# Patient Record
Sex: Female | Born: 1990 | Race: White | Hispanic: No | Smoking: Current every day smoker
Health system: Southern US, Community
[De-identification: ages and names within clinical notes are randomized; demographics above are authoritative.]

## PROBLEM LIST (undated history)

## (undated) DIAGNOSIS — G43909 Migraine, unspecified, not intractable, without status migrainosus: Secondary | ICD-10-CM

## (undated) DIAGNOSIS — T7840XA Allergy, unspecified, initial encounter: Secondary | ICD-10-CM

## (undated) DIAGNOSIS — F32A Depression, unspecified: Secondary | ICD-10-CM

## (undated) DIAGNOSIS — Z9289 Personal history of other medical treatment: Secondary | ICD-10-CM

## (undated) DIAGNOSIS — F329 Major depressive disorder, single episode, unspecified: Secondary | ICD-10-CM

## (undated) DIAGNOSIS — B019 Varicella without complication: Secondary | ICD-10-CM

## (undated) DIAGNOSIS — B009 Herpesviral infection, unspecified: Secondary | ICD-10-CM

## (undated) DIAGNOSIS — K219 Gastro-esophageal reflux disease without esophagitis: Secondary | ICD-10-CM

## (undated) HISTORY — DX: Gastro-esophageal reflux disease without esophagitis: K21.9

## (undated) HISTORY — DX: Personal history of other medical treatment: Z92.89

## (undated) HISTORY — DX: Herpesviral infection, unspecified: B00.9

## (undated) HISTORY — DX: Migraine, unspecified, not intractable, without status migrainosus: G43.909

## (undated) HISTORY — DX: Allergy, unspecified, initial encounter: T78.40XA

## (undated) HISTORY — DX: Depression, unspecified: F32.A

## (undated) HISTORY — DX: Varicella without complication: B01.9

## (undated) HISTORY — DX: Major depressive disorder, single episode, unspecified: F32.9

---
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
## 2009-07-20 ENCOUNTER — Ambulatory Visit: Payer: Self-pay | Admitting: Internal Medicine

## 2010-03-07 IMAGING — CT CT HEAD WITHOUT CONTRAST
2 series · 16 of 30 positions shown, 20 images · non-contrast
Comparison: none

REASON FOR EXAM: syncope  CT Head 1pm  EKG at 130p
COMMENTS:

[Series 2: without · axial · non-contrast · 0.39mm/px · z∈[+718,+838]mm · 13 of 30 slices shown, 17 images]
[im 3/30  brain]
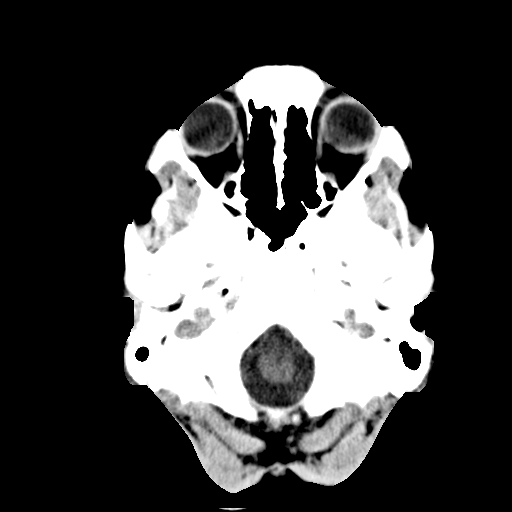
[im 3/30  bone]
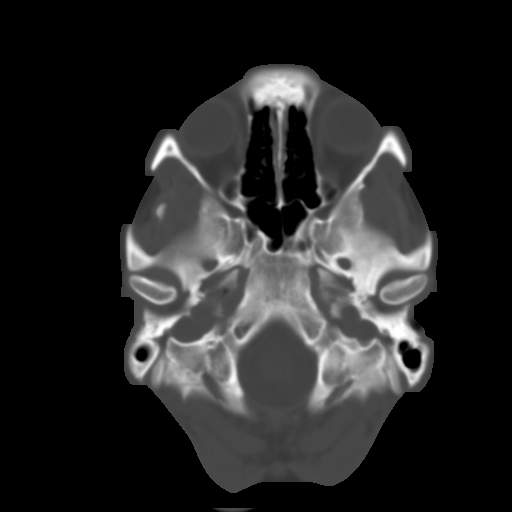
[im 5/30  brain]
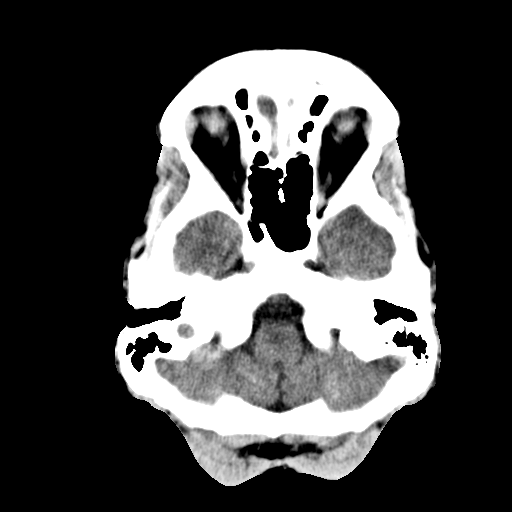
[im 7/30  brain]
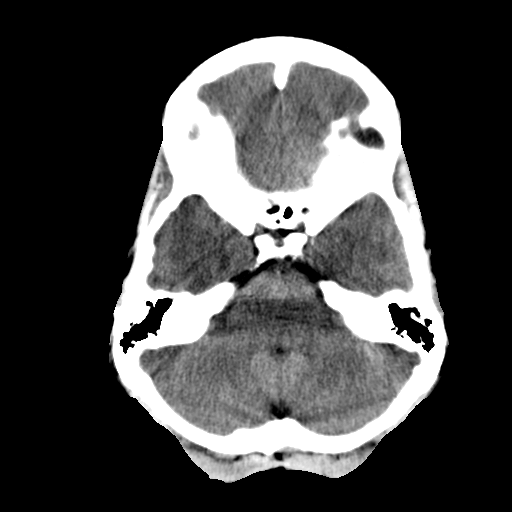
[im 9/30  brain]
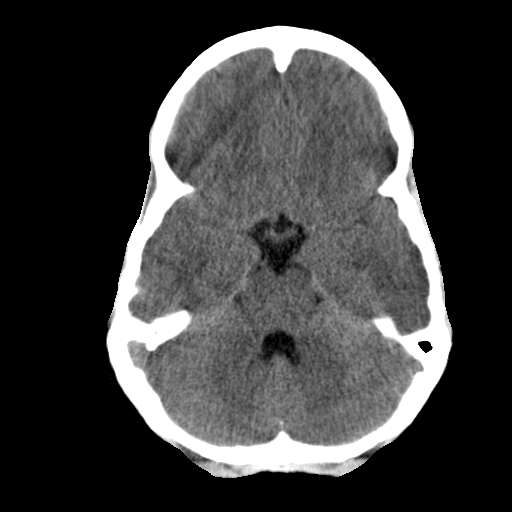
[im 11/30  brain]
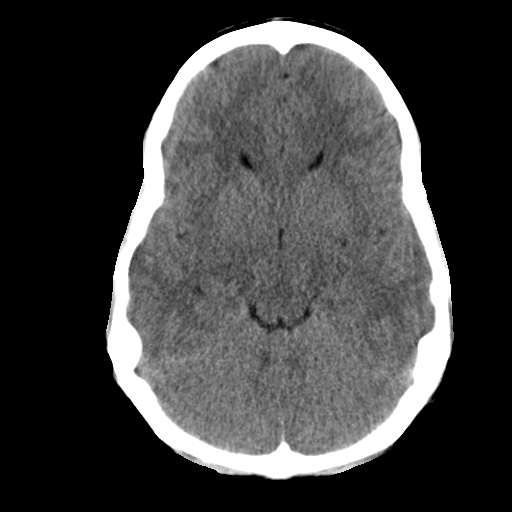
[im 11/30  bone]
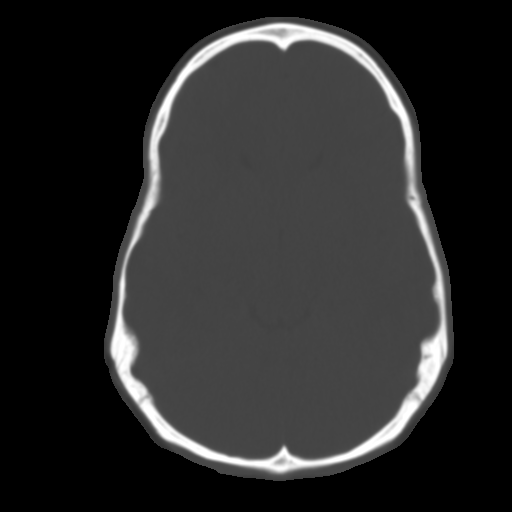
[im 13/30  brain]
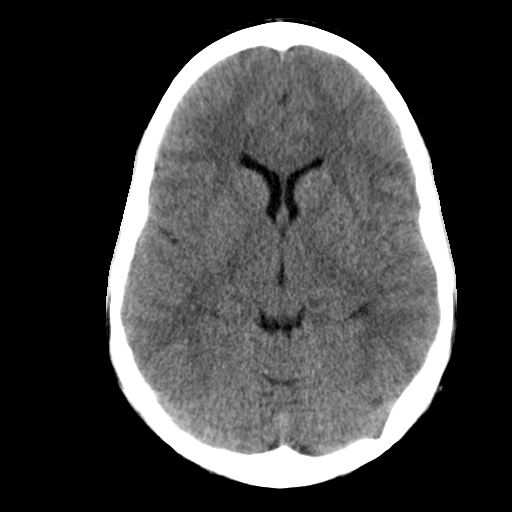
[im 15/30  brain]
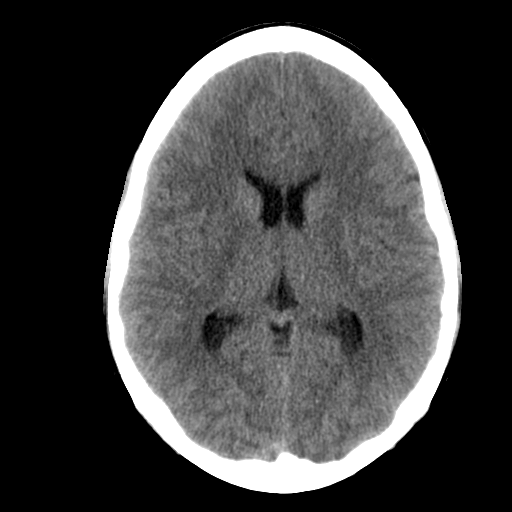
[im 17/30  brain]
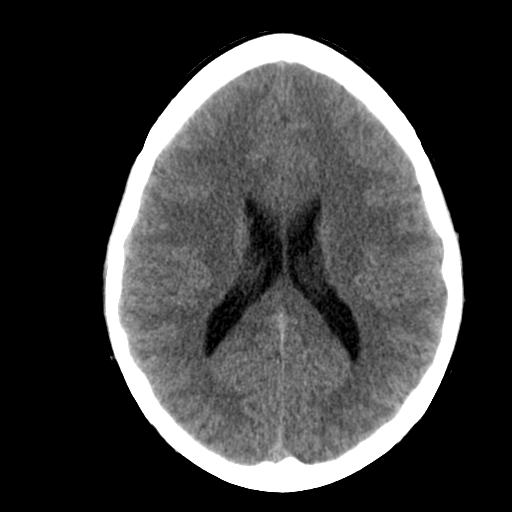
[im 19/30  brain]
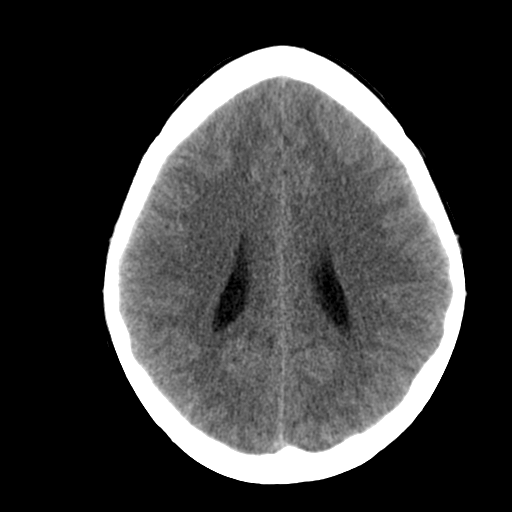
[im 19/30  bone]
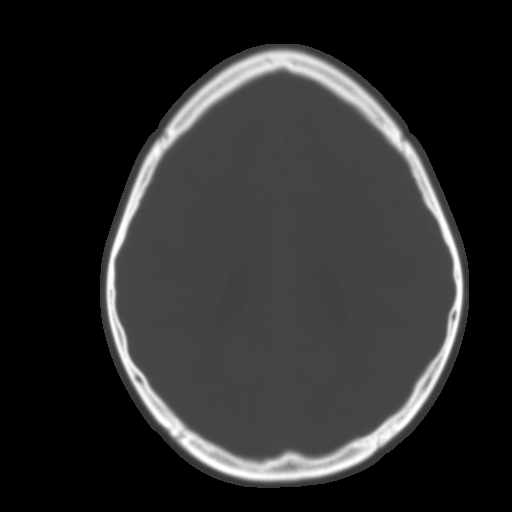
[im 21/30  brain]
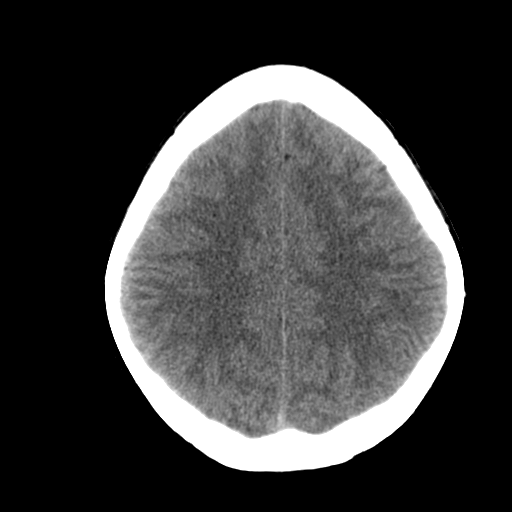
[im 23/30  brain]
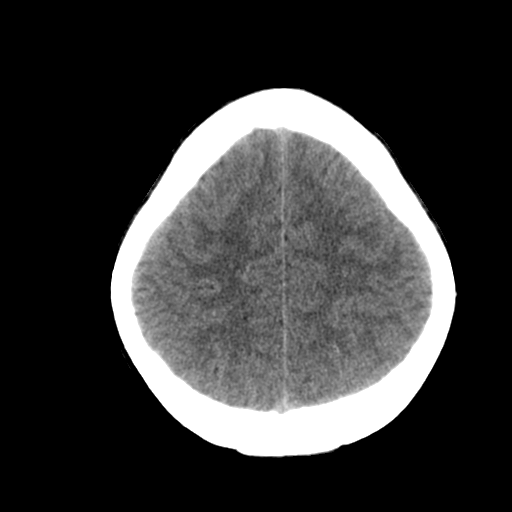
[im 25/30  brain]
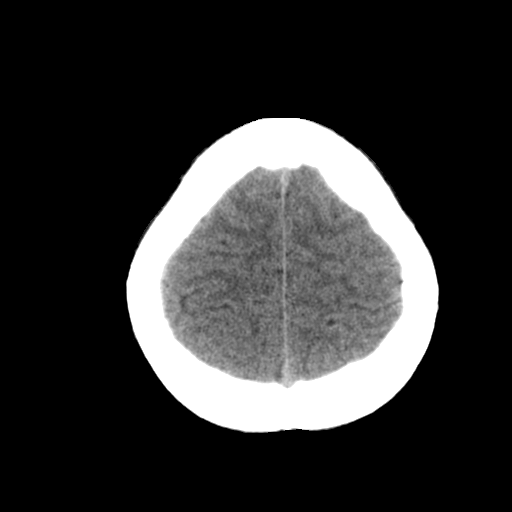
[im 27/30  brain]
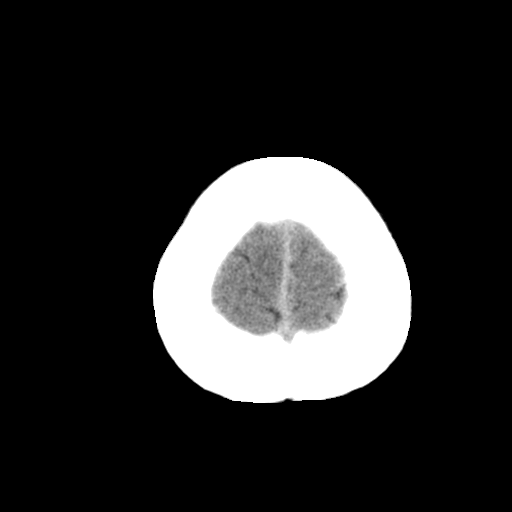
[im 27/30  bone]
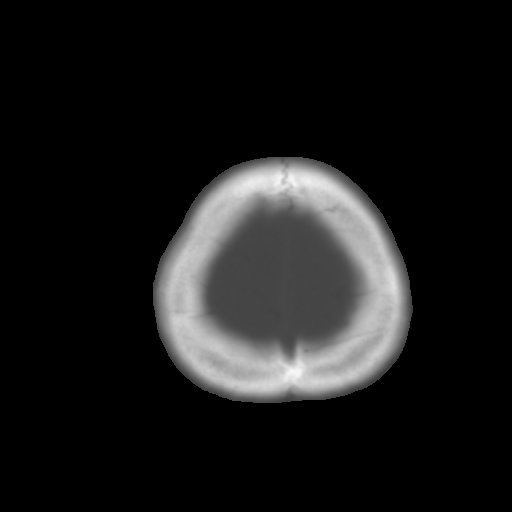

[Series 3: bone · axial · 0.39mm/px · z∈[+718,+758]mm · 3 of 30 slices shown]
[im 3/30  bone]
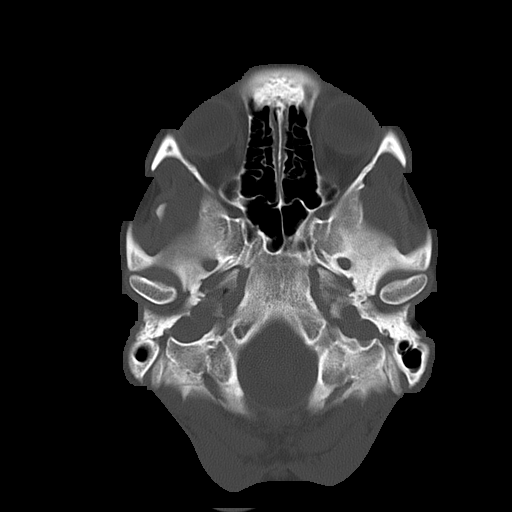
[im 7/30  bone]
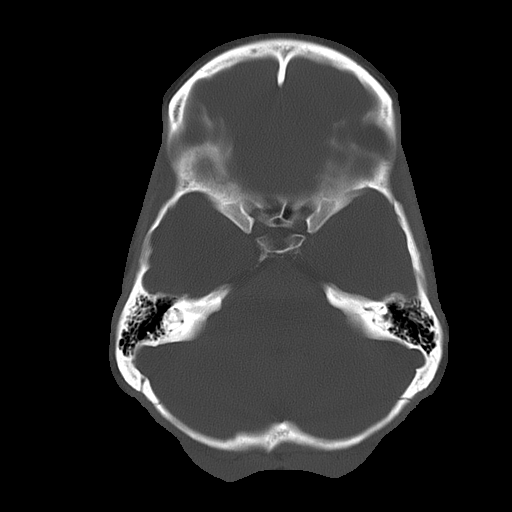
[im 11/30  bone]
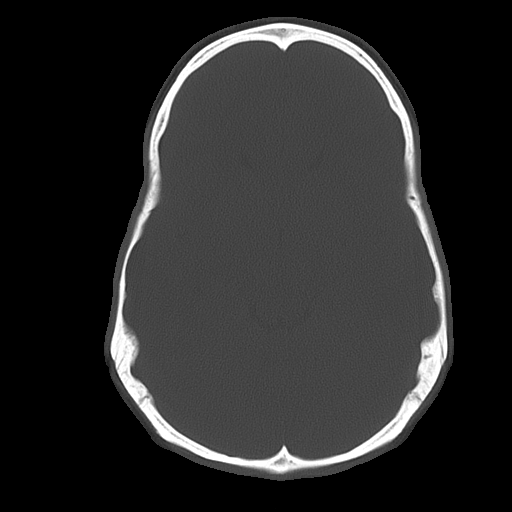

[16 of 30 positions shown; findings below may reference images not displayed]

PROCEDURE:     CT  - CT HEAD WITHOUT CONTRAST  - July 20, 2009  [DATE]

RESULT:     Noncontrast CT of the brain is performed in the standard
fashion. There is no previous examination for comparison.

The frontal sinuses are hypoplastic. The maxillary sinuses are not included.
There is there is a small portion of the left maxillary sinus included which
is opacified. Correlate clinically.

The calvarium is intact. The mastoids show normal aeration. The ventricles
and sulci are normal. There is no hemorrhage, edema, mass effect or evidence
of increased intracranial pressure. There is no territorial infarct.
IMPRESSION: The first image at the inferior most point of the scan shows some
opacification in the left maxillary region. Correlate for possible
sinusitis. A mucus retention cyst or polyp is not excluded.

## 2010-07-14 ENCOUNTER — Ambulatory Visit: Payer: Self-pay | Admitting: Internal Medicine

## 2010-10-30 HISTORY — PX: WISDOM TOOTH EXTRACTION: SHX21

## 2011-03-01 IMAGING — US US EXTREM LOW VENOUS*R*
1 series · 17 of 24 positions shown · non-contrast
Comparison: none

REASON FOR EXAM: CR [id number] RT Leg Pain Swelling Eval DVT
COMMENTS:

[Series 1: us extrem low venous*right* · 17 of 27 slices shown]
[im 1/27]
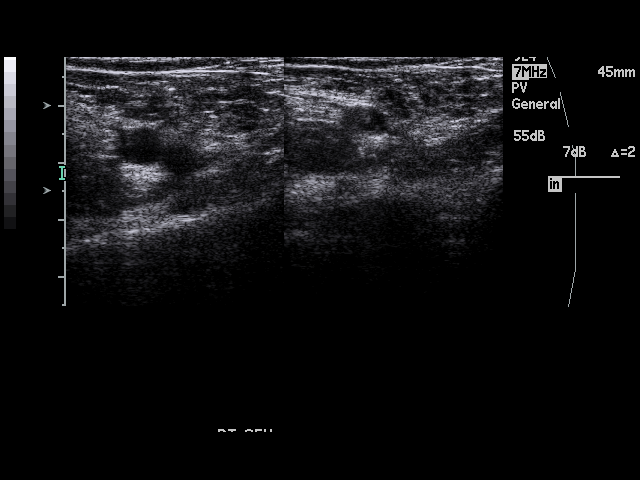
[im 3/27]
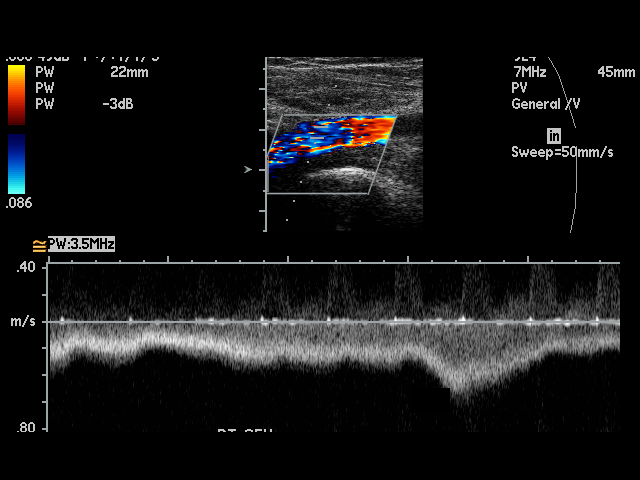
[im 4/27]
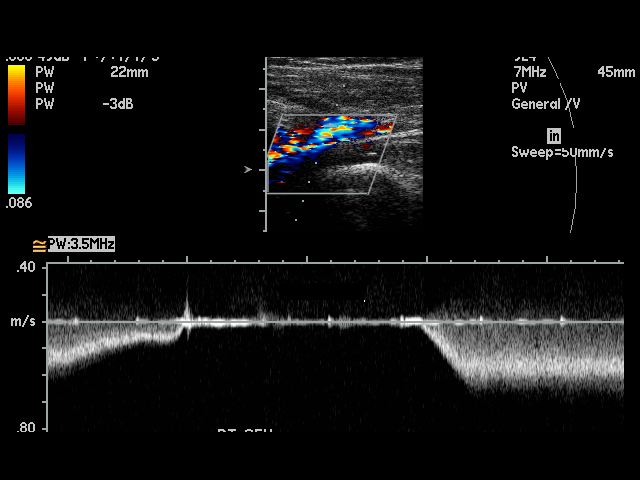
[im 5/27]
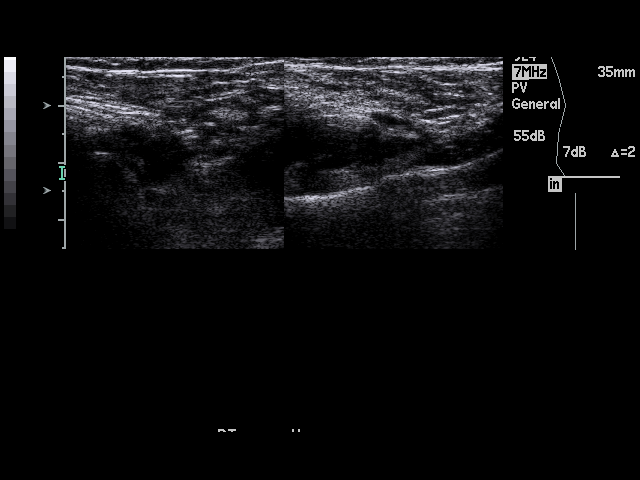
[im 7/27]
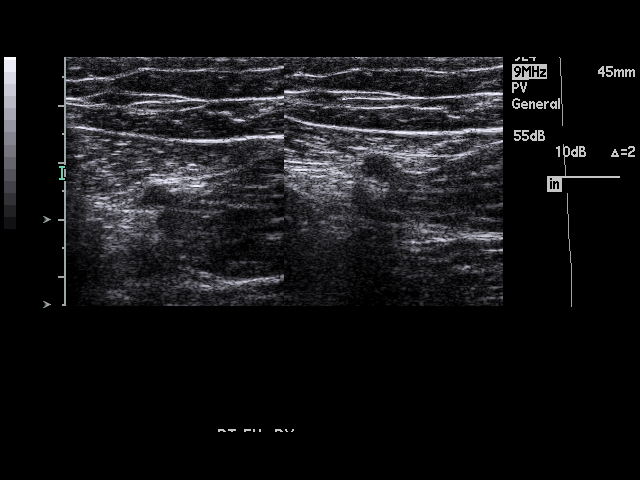
[im 8/27]
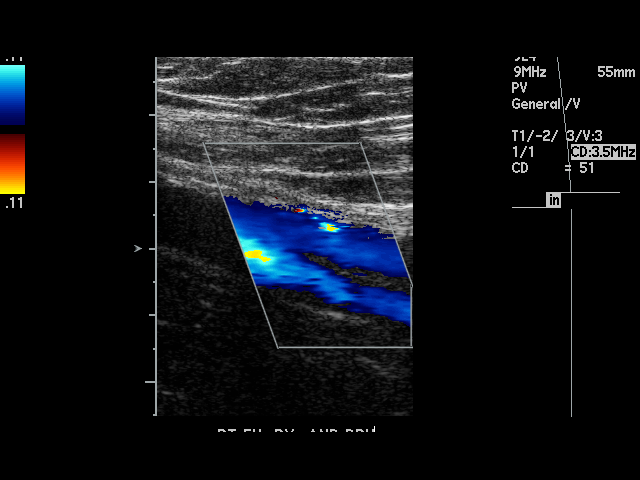
[im 11/27]
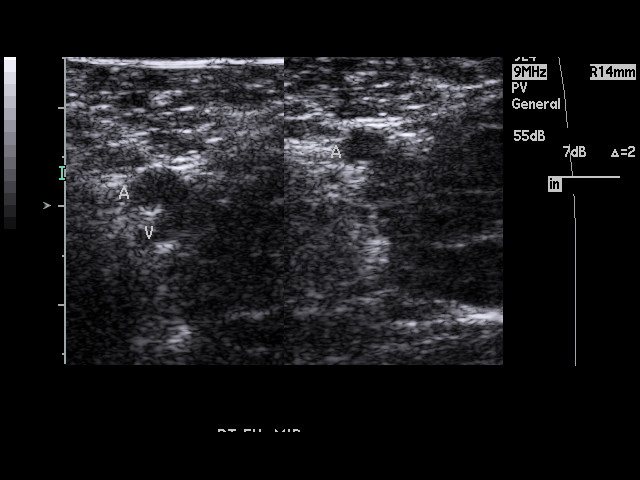
[im 12/27]
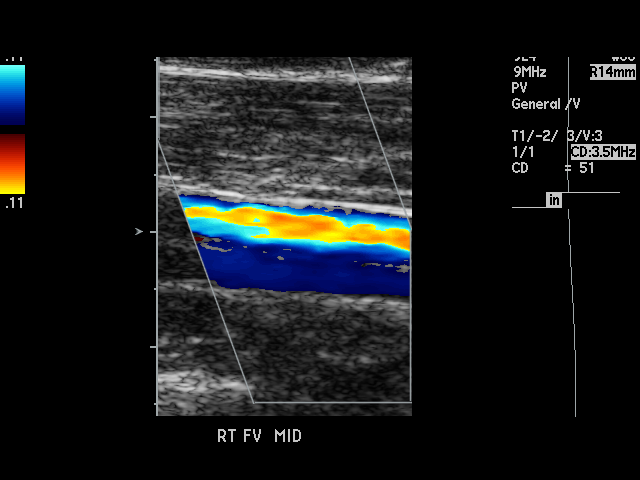
[im 14/27]
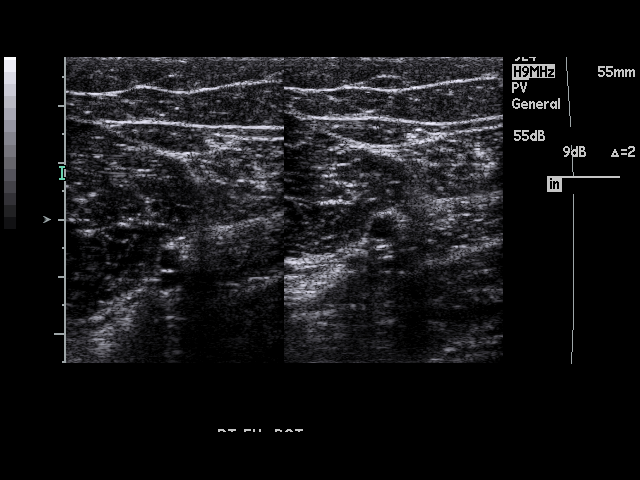
[im 15/27]
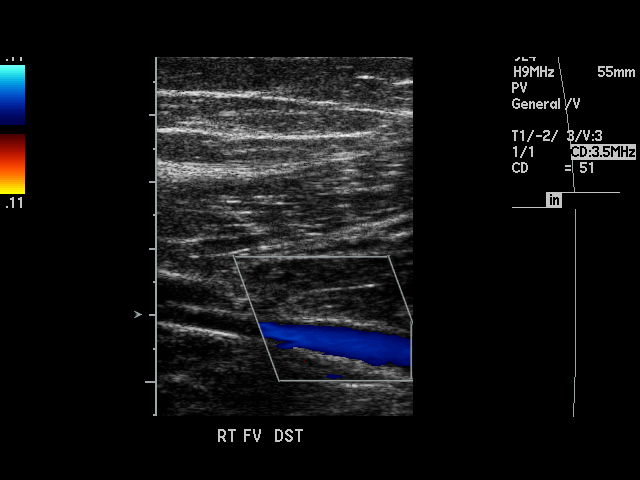
[im 16/27]
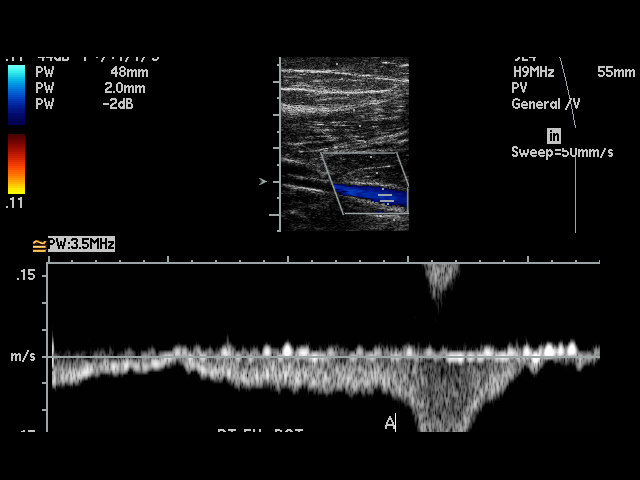
[im 19/27]
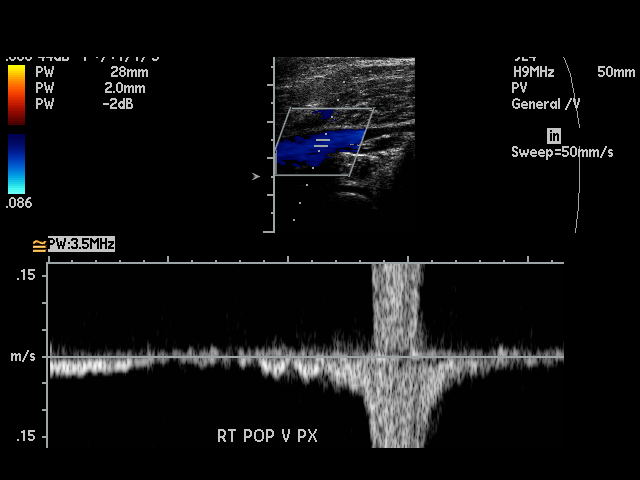
[im 20/27]
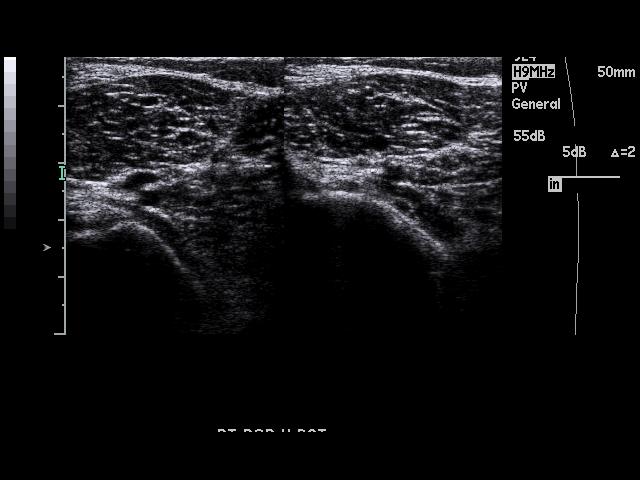
[im 22/27]
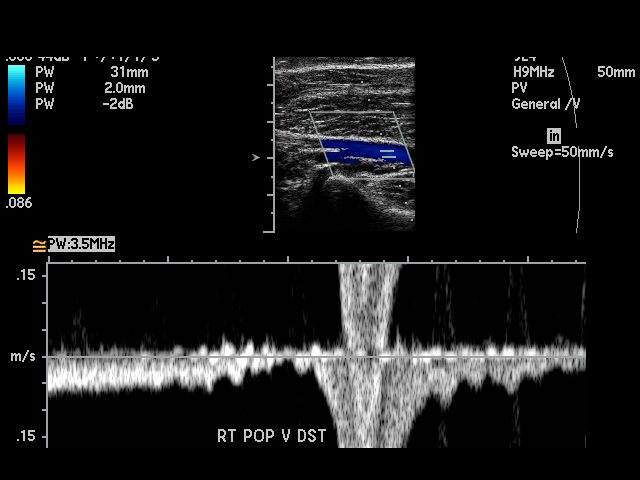
[im 23/27]
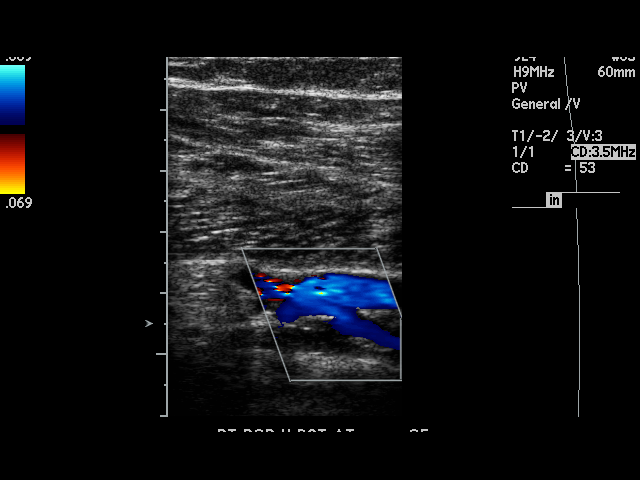
[im 24/27]
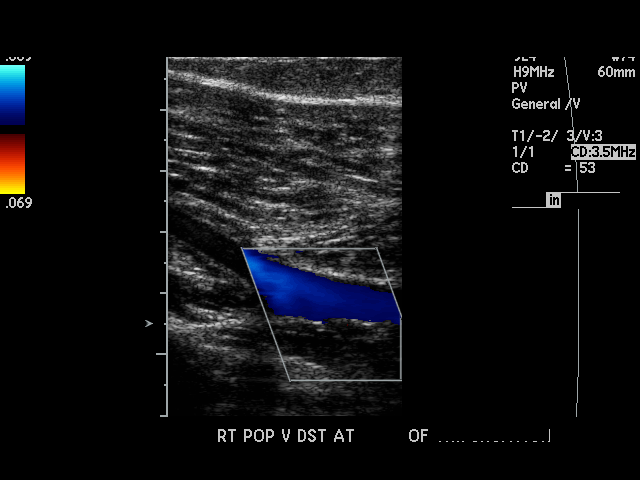
[im 27/27]
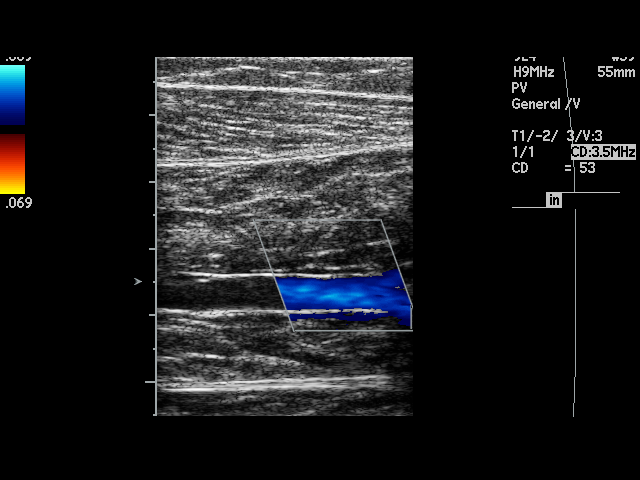

[17 of 24 positions shown; findings below may reference images not displayed]

PROCEDURE:     FP - FP DOPPLER VEINS RT LEG EXTR  - July 14, 2010  [DATE]

RESULT:     Comparison: None

Technique and findings: Multiple longitudinal and transverse grayscale as
well as color and spectral Doppler images of the right lower extremity veins
were obtained from the common femoral veins through the popliteal veins.

The right common femoral, femoral, and popliteal veins are patent,
demonstrating normal color-flow and compressibility. No intraluminal
thrombus is identified.  There is normal respiratory variation and
augmentation demonstrated at all vein levels.
IMPRESSION: No evidence of DVT in the right lower extremity.

## 2015-06-10 LAB — HM PAP SMEAR: HM Pap smear: NEGATIVE

## 2016-02-09 ENCOUNTER — Encounter: Payer: Self-pay | Admitting: Internal Medicine

## 2016-02-09 ENCOUNTER — Ambulatory Visit (INDEPENDENT_AMBULATORY_CARE_PROVIDER_SITE_OTHER): Payer: 59 | Admitting: Internal Medicine

## 2016-02-09 VITALS — BP 104/70 | HR 95 | Temp 98.0°F | Resp 12 | Ht 67.25 in | Wt 158.5 lb

## 2016-02-09 DIAGNOSIS — R002 Palpitations: Secondary | ICD-10-CM

## 2016-02-09 DIAGNOSIS — F330 Major depressive disorder, recurrent, mild: Secondary | ICD-10-CM

## 2016-02-09 DIAGNOSIS — E785 Hyperlipidemia, unspecified: Secondary | ICD-10-CM | POA: Diagnosis not present

## 2016-02-09 DIAGNOSIS — Z8669 Personal history of other diseases of the nervous system and sense organs: Secondary | ICD-10-CM | POA: Diagnosis not present

## 2016-02-09 LAB — LIPID PANEL
CHOLESTEROL: 123 mg/dL (ref 0–200)
HDL: 45.4 mg/dL (ref >39.00)
LDL Cholesterol: 65 mg/dL (ref 0–99)
NONHDL: 77.78
Total CHOL/HDL Ratio: 3
Triglycerides: 62 mg/dL (ref 0.0–149.0)
VLDL: 12.4 mg/dL (ref 0.0–40.0)

## 2016-02-09 LAB — CBC WITH DIFFERENTIAL/PLATELET
BASOS ABS: 0 10*3/uL (ref 0.0–0.1)
Basophils Relative: 0.4 % (ref 0.0–3.0)
EOS PCT: 1.2 % (ref 0.0–5.0)
Eosinophils Absolute: 0.1 10*3/uL (ref 0.0–0.7)
HCT: 40.2 % (ref 36.0–46.0)
HEMOGLOBIN: 13.8 g/dL (ref 12.0–15.0)
LYMPHS ABS: 2.4 10*3/uL (ref 0.7–4.0)
LYMPHS PCT: 37.7 % (ref 12.0–46.0)
MCHC: 34.4 g/dL (ref 30.0–36.0)
MCV: 88.6 fl (ref 78.0–100.0)
MONOS PCT: 7.7 % (ref 3.0–12.0)
Monocytes Absolute: 0.5 10*3/uL (ref 0.1–1.0)
NEUTROS PCT: 53 % (ref 43.0–77.0)
Neutro Abs: 3.4 10*3/uL (ref 1.4–7.7)
Platelets: 285 10*3/uL (ref 150.0–400.0)
RBC: 4.54 Mil/uL (ref 3.87–5.11)
RDW: 13.1 % (ref 11.5–15.5)
WBC: 6.3 10*3/uL (ref 4.0–10.5)

## 2016-02-09 LAB — COMPREHENSIVE METABOLIC PANEL
ALBUMIN: 4.3 g/dL (ref 3.5–5.2)
ALK PHOS: 73 U/L (ref 39–117)
ALT: 9 U/L (ref 0–35)
AST: 14 U/L (ref 0–37)
BUN: 11 mg/dL (ref 6–23)
CALCIUM: 9.2 mg/dL (ref 8.4–10.5)
CO2: 26 mEq/L (ref 19–32)
Chloride: 105 mEq/L (ref 96–112)
Creatinine, Ser: 0.82 mg/dL (ref 0.40–1.20)
GFR: 89.47 mL/min (ref >60.00)
GLUCOSE: 85 mg/dL (ref 70–99)
POTASSIUM: 3.9 meq/L (ref 3.5–5.1)
Sodium: 139 mEq/L (ref 135–145)
TOTAL PROTEIN: 6.8 g/dL (ref 6.0–8.3)
Total Bilirubin: 0.6 mg/dL (ref 0.2–1.2)

## 2016-02-09 LAB — TSH: TSH: 1.06 u[IU]/mL (ref 0.35–4.50)

## 2016-02-09 MED ORDER — PROPRANOLOL HCL 10 MG PO TABS: 5.0000 mg | ORAL_TABLET | Freq: Three times a day (TID) | ORAL | Status: DC

## 2016-02-09 MED ORDER — FLUOXETINE HCL 20 MG PO CAPS: 20.0000 mg | ORAL_CAPSULE | Freq: Every day | ORAL | Status: DC

## 2016-02-09 NOTE — Patient Instructions (Signed)
It was great seeing you!!  I have given you a prescription for a beta blocker (propranolol).  This is a medication that you can take when you feel panicky and your heart starts to race.  It will slow your heart rate down without making you feel drowsy  Take 1/2 tablet AS NEEDED.  Not more than once daily    Return in October for your annual CPE
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
## 2016-02-09 NOTE — Progress Notes (Signed)
Pre-visit discussion using our clinic review tool. No additional management support is needed unless otherwise documented below in the visit note.  

## 2016-02-12 DIAGNOSIS — F330 Major depressive disorder, recurrent, mild: Secondary | ICD-10-CM | POA: Insufficient documentation

## 2016-02-12 DIAGNOSIS — R002 Palpitations: Secondary | ICD-10-CM | POA: Insufficient documentation

## 2016-02-12 DIAGNOSIS — E785 Hyperlipidemia, unspecified: Secondary | ICD-10-CM | POA: Insufficient documentation

## 2016-02-12 DIAGNOSIS — Z8669 Personal history of other diseases of the nervous system and sense organs: Secondary | ICD-10-CM | POA: Insufficient documentation

## 2016-02-12 NOTE — Assessment & Plan Note (Addendum)
Managed with Prozac, .  Aggravated by impending divorce .  No changes today

## 2016-02-12 NOTE — Assessment & Plan Note (Signed)
lipids are normal today

## 2016-02-12 NOTE — Assessment & Plan Note (Signed)
Low dose beta blocker prescribed.

## 2016-02-15 ENCOUNTER — Encounter: Payer: Self-pay | Admitting: *Deleted

## 2016-08-11 ENCOUNTER — Encounter: Payer: Self-pay | Admitting: Internal Medicine

## 2016-08-11 ENCOUNTER — Ambulatory Visit (INDEPENDENT_AMBULATORY_CARE_PROVIDER_SITE_OTHER): Payer: 59 | Admitting: Internal Medicine

## 2016-08-11 DIAGNOSIS — J3089 Other allergic rhinitis: Secondary | ICD-10-CM | POA: Diagnosis not present

## 2016-08-11 DIAGNOSIS — Z23 Encounter for immunization: Secondary | ICD-10-CM | POA: Diagnosis not present

## 2016-08-11 DIAGNOSIS — F330 Major depressive disorder, recurrent, mild: Secondary | ICD-10-CM

## 2016-08-11 DIAGNOSIS — Z0000 Encounter for general adult medical examination without abnormal findings: Secondary | ICD-10-CM

## 2016-08-11 MED ORDER — TRAZODONE HCL 50 MG PO TABS: 25.0000 mg | ORAL_TABLET | Freq: Every evening | ORAL | 3 refills | Status: AC | PRN

## 2016-08-11 NOTE — Progress Notes (Signed)
A user error has taken place.

## 2016-08-11 NOTE — Progress Notes (Signed)
Pre visit review using our clinic review tool, if applicable. No additional management support is needed unless otherwise documented below in the visit note. 

## 2016-08-11 NOTE — Patient Instructions (Addendum)
You can use afrin daily if you are using flonase daily   Away  Flush before  you use medicated sprays   During non work days you can take Zyrtec D.    Drink a protein shake instead of skipping breakfast   You might want to try a premixed protein drink called Premier Protein shake for breakfast or late night snack . It is great tasting,   very low sugar and available of < $2 serving at [facility] and  In bulk for $1.50/serving at [employer] and [employer]  .    Nutritional analysis :  160 cal  30 g protein  1 g sugar 50% calcium needs   [employer] and [employer] .  Caramel flavor is the best !!    Health Maintenance, Female Adopting a healthy lifestyle and getting preventive care can go a long way to promote health and wellness. Talk with your health care provider about what schedule of regular examinations is right for you. This is a good chance for you to check in with your provider about disease prevention and staying healthy. In between checkups, there are plenty of things you can do on your own. Experts have done a lot of research about which lifestyle changes and preventive measures are most likely to keep you healthy. Ask your health care provider for more information. WEIGHT AND DIET  Eat a healthy diet  Be sure to include plenty of vegetables, fruits, low-fat dairy products, and lean protein.  Do not eat a lot of foods high in solid fats, added sugars, or salt.  Get regular exercise. This is one of the most important things you can do for your health.  Most adults should exercise for at least 150 minutes each week. The exercise should increase your heart rate and make you sweat (moderate-intensity exercise).  Most adults should also do strengthening exercises at least twice a week. This is in addition to the moderate-intensity exercise.  Maintain a healthy weight  Body mass index (BMI) is a measurement that can be used to identify possible weight problems. It estimates body fat based on  height and weight. Your health care provider can help determine your BMI and help you achieve or maintain a healthy weight.  For females 79 years of age and older:   A BMI below 18.5 is considered underweight.  A BMI of 18.5 to 24.9 is normal.  A BMI of 25 to 29.9 is considered overweight.  A BMI of 30 and above is considered obese.  Watch levels of cholesterol and blood lipids  You should start having your blood tested for lipids and cholesterol at 26 years of age, then have this test every 5 years.  You may need to have your cholesterol levels checked more often if:  Your lipid or cholesterol levels are high.  You are older than 26 years of age.  You are at high risk for heart disease.  CANCER SCREENING   Lung Cancer  Lung cancer screening is recommended for adults 36-38 years old who are at high risk for lung cancer because of a history of smoking.  A yearly low-dose CT scan of the lungs is recommended for people who:  Currently smoke.  Have quit within the past 15 years.  Have at least a 30-pack-year history of smoking. A pack year is smoking an average of one pack of cigarettes a day for 1 year.  Yearly screening should continue until it has been 15 years since you  quit.  Yearly screening should stop if you develop a health problem that would prevent you from having lung cancer treatment.  Breast Cancer  Practice breast self-awareness. This means understanding how your breasts normally appear and feel.  It also means doing regular breast self-exams. Let your health care provider know about any changes, no matter how small.  If you are in your 20s or 30s, you should have a clinical breast exam (CBE) by a health care provider every 1-3 years as part of a regular health exam.  If you are 20 or older, have a CBE every year. Also consider having a breast X-ray (mammogram) every year.  If you have a family history of breast cancer, talk to your health care  provider about genetic screening.  If you are at high risk for breast cancer, talk to your health care provider about having an MRI and a mammogram every year.  Breast cancer gene (BRCA) assessment is recommended for women who have family members with BRCA-related cancers. BRCA-related cancers include:  Breast.  Ovarian.  Tubal.  Peritoneal cancers.  Results of the assessment will determine the need for genetic counseling and BRCA1 and BRCA2 testing. Cervical Cancer Your health care provider may recommend that you be screened regularly for cancer of the pelvic organs (ovaries, uterus, and vagina). This screening involves a pelvic examination, including checking for microscopic changes to the surface of your cervix (Pap test). You may be encouraged to have this screening done every 3 years, beginning at age 55.  For women ages 81-65, health care providers may recommend pelvic exams and Pap testing every 3 years, or they may recommend the Pap and pelvic exam, combined with testing for human papilloma virus (HPV), every 5 years. Some types of HPV increase your risk of cervical cancer. Testing for HPV may also be done on women of any age with unclear Pap test results.  Other health care providers may not recommend any screening for nonpregnant women who are considered low risk for pelvic cancer and who do not have symptoms. Ask your health care provider if a screening pelvic exam is right for you.  If you have had past treatment for cervical cancer or a condition that could lead to cancer, you need Pap tests and screening for cancer for at least 20 years after your treatment. If Pap tests have been discontinued, your risk factors (such as having a new sexual partner) need to be reassessed to determine if screening should resume. Some women have medical problems that increase the chance of getting cervical cancer. In these cases, your health care provider may recommend more frequent screening and  Pap tests. Colorectal Cancer  This type of cancer can be detected and often prevented.  Routine colorectal cancer screening usually begins at 26 years of age and continues through 26 years of age.  Your health care provider may recommend screening at an earlier age if you have risk factors for colon cancer.  Your health care provider may also recommend using home test kits to check for hidden blood in the stool.  A small camera at the end of a tube can be used to examine your colon directly (sigmoidoscopy or colonoscopy). This is done to check for the earliest forms of colorectal cancer.  Routine screening usually begins at age 58.  Direct examination of the colon should be repeated every 5-10 years through 26 years of age. However, you may need to be screened more often if early forms of precancerous  polyps or small growths are found. Skin Cancer  Check your skin from head to toe regularly.  Tell your health care provider about any new moles or changes in moles, especially if there is a change in a mole's shape or color.  Also tell your health care provider if you have a mole that is larger than the size of a pencil eraser.  Always use sunscreen. Apply sunscreen liberally and repeatedly throughout the day.  Protect yourself by wearing long sleeves, pants, a wide-brimmed hat, and sunglasses whenever you are outside. HEART DISEASE, DIABETES, AND HIGH BLOOD PRESSURE   High blood pressure causes heart disease and increases the risk of stroke. High blood pressure is more likely to develop in:  People who have blood pressure in the high end of the normal range (130-139/85-89 mm Hg).  People who are overweight or obese.  People who are African American.  If you are 49-69 years of age, have your blood pressure checked every 3-5 years. If you are 69 years of age or older, have your blood pressure checked every year. You should have your blood pressure measured twice--once when you are at  a hospital or clinic, and once when you are not at a hospital or clinic. Record the average of the two measurements. To check your blood pressure when you are not at a hospital or clinic, you can use:  An automated blood pressure machine at a pharmacy.  A home blood pressure monitor.  If you are between 20 years and 60 years old, ask your health care provider if you should take aspirin to prevent strokes.  Have regular diabetes screenings. This involves taking a blood sample to check your fasting blood sugar level.  If you are at a normal weight and have a low risk for diabetes, have this test once every three years after 26 years of age.  If you are overweight and have a high risk for diabetes, consider being tested at a younger age or more often. PREVENTING INFECTION  Hepatitis B  If you have a higher risk for hepatitis B, you should be screened for this virus. You are considered at high risk for hepatitis B if:  You were born in a country where hepatitis B is common. Ask your health care provider which countries are considered high risk.  Your parents were born in a high-risk country, and you have not been immunized against hepatitis B (hepatitis B vaccine).  You have HIV or AIDS.  You use needles to inject street drugs.  You live with someone who has hepatitis B.  You have had sex with someone who has hepatitis B.  You get hemodialysis treatment.  You take certain medicines for conditions, including cancer, organ transplantation, and autoimmune conditions. Hepatitis C  Blood testing is recommended for:  Everyone born from [birth year] through 1966.  Anyone with known risk factors for hepatitis C. Sexually transmitted infections (STIs)  You should be screened for sexually transmitted infections (STIs) including gonorrhea and chlamydia if:  You are sexually active and are younger than 26 years of age.  You are older than 26 years of age and your health care provider tells you  that you are at risk for this type of infection.  Your sexual activity has changed since you were last screened and you are at an increased risk for chlamydia or gonorrhea. Ask your health care provider if you are at risk.  If you do not have HIV, but are at risk, it  may be recommended that you take a prescription medicine daily to prevent HIV infection. This is called pre-exposure prophylaxis (PrEP). You are considered at risk if:  You are sexually active and do not regularly use condoms or know the HIV status of your partner(s).  You take drugs by injection.  You are sexually active with a partner who has HIV. Talk with your health care provider about whether you are at high risk of being infected with HIV. If you choose to begin PrEP, you should first be tested for HIV. You should then be tested every 3 months for as long as you are taking PrEP.  PREGNANCY   If you are premenopausal and you may become pregnant, ask your health care provider about preconception counseling.  If you may become pregnant, take 400 to 800 micrograms (mcg) of folic acid every day.  If you want to prevent pregnancy, talk to your health care provider about birth control (contraception). OSTEOPOROSIS AND MENOPAUSE   Osteoporosis is a disease in which the bones lose minerals and strength with aging. This can result in serious bone fractures. Your risk for osteoporosis can be identified using a bone density scan.  If you are 10 years of age or older, or if you are at risk for osteoporosis and fractures, ask your health care provider if you should be screened.  Ask your health care provider whether you should take a calcium or vitamin D supplement to lower your risk for osteoporosis.  Menopause may have certain physical symptoms and risks.  Hormone replacement therapy may reduce some of these symptoms and risks. Talk to your health care provider about whether hormone replacement therapy is right for you.  HOME  CARE INSTRUCTIONS   Schedule regular health, dental, and eye exams.  Stay current with your immunizations.   Do not use any tobacco products including cigarettes, chewing tobacco, or electronic cigarettes.  If you are pregnant, do not drink alcohol.  If you are breastfeeding, limit how much and how often you drink alcohol.  Limit alcohol intake to no more than 1 drink per day for nonpregnant women. One drink equals 12 ounces of beer, 5 ounces of wine, or 1 ounces of hard liquor.  Do not use street drugs.  Do not share needles.  Ask your health care provider for help if you need support or information about quitting drugs.  Tell your health care provider if you often feel depressed.  Tell your health care provider if you have ever been abused or do not feel safe at home.   This information is not intended to replace advice given to you by your health care provider. Make sure you discuss any questions you have with your health care provider.   Document Released: 05/01/2011 Document Revised: 11/06/2014 Document Reviewed: 09/17/2013 Elsevier Interactive Patient Education [employer].
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
## 2016-08-13 DIAGNOSIS — J309 Allergic rhinitis, unspecified: Secondary | ICD-10-CM | POA: Insufficient documentation

## 2016-08-13 DIAGNOSIS — Z0000 Encounter for general adult medical examination without abnormal findings: Secondary | ICD-10-CM | POA: Insufficient documentation

## 2016-08-13 NOTE — Assessment & Plan Note (Signed)
Recommend adding daily Afrin prn congestion since she is using a steroid nasal spray and antihistamine.

## 2016-08-13 NOTE — Assessment & Plan Note (Signed)
Annual comprehensive preventive exam was done as well as an evaluation and management of chronic conditions .  During the course of the visit the patient was educated and counseled about appropriate screening and preventive services including :  diabetes screening, lipid analysis with projected  10 year  risk for CAD , nutrition counseling, breast, cervical and colorectal cancer screening, and recommended immunizations.  Printed recommendations for health maintenance screenings was given 
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
## 2016-08-31 ENCOUNTER — Other Ambulatory Visit: Payer: Self-pay | Admitting: Internal Medicine

## 2016-08-31 ENCOUNTER — Telehealth: Payer: Self-pay | Admitting: Internal Medicine

## 2016-08-31 NOTE — Telephone Encounter (Signed)
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 Papers are up front in color folder.

## 2016-09-01 NOTE — Telephone Encounter (Signed)
Fill what has to be done by you I can transcribe Immunizations in red folder.

## 2016-09-03 NOTE — Telephone Encounter (Signed)
Signed. returend in red foldler

## 2016-09-05 NOTE — Telephone Encounter (Signed)
Paper work to be completed once patient has PPD placed at nurse visit on 09/12/16 paperwork given to Irelandanya and Kristin.

## 2016-09-12 ENCOUNTER — Ambulatory Visit (INDEPENDENT_AMBULATORY_CARE_PROVIDER_SITE_OTHER): Payer: 59

## 2016-09-12 DIAGNOSIS — Z111 Encounter for screening for respiratory tuberculosis: Secondary | ICD-10-CM

## 2016-09-14 ENCOUNTER — Ambulatory Visit (INDEPENDENT_AMBULATORY_CARE_PROVIDER_SITE_OTHER): Payer: 59

## 2016-09-14 DIAGNOSIS — Z111 Encounter for screening for respiratory tuberculosis: Secondary | ICD-10-CM

## 2016-09-14 LAB — TB SKIN TEST
INDURATION: 0 mm
TB SKIN TEST: NEGATIVE

## 2016-09-14 NOTE — Progress Notes (Signed)
Patient came in for PPD reading .  See results.

## 2016-09-14 NOTE — Telephone Encounter (Signed)
Paperwork completed and given to patient today. thanks

## 2016-09-26 NOTE — Progress Notes (Signed)
  I have reviewed the above information and agree with above.   Libbi Towner, MD 

## 2016-10-18 ENCOUNTER — Telehealth: Payer: Self-pay | Admitting: Internal Medicine

## 2016-10-18 NOTE — Telephone Encounter (Signed)
Pt called and thinks that she has hand foot and mouth from her nephew. Pt is c/o low grade fever, rash/bumps on hands that are sore to the touch, blisters in the back of her throat.

## 2016-10-18 NOTE — Telephone Encounter (Signed)
FYI

## 2016-10-18 NOTE — Telephone Encounter (Signed)
[facility] [place] TELEPHONE ADVICE RECORD [facility] Patient Name: Lori BernardKELLI Kennedy DOB: [birth year] Initial Comment rash and bumps on back of hand hat are sore to the touch, on palms and bottom of feet, blisters in back of throat, hurts to swallowand, low grade fever Nurse Assessment Nurse: Vickey SagesAtkins, RN, Jacquilin Date/Time (Eastern Time): 10/18/2016 10:11:01 AM Confirm and document reason for call. If symptomatic, describe symptoms. ---Caller states she has blisters on the palms of her hands, bottom of her feet, and blisters in her throat. Temp is 99 Does the patient have any new or worsening symptoms? ---Yes Will a triage be completed? ---Yes Related visit to physician within the last 2 weeks? ---No Does the PT have any chronic conditions? (i.e. diabetes, asthma, etc.) ---Yes List chronic conditions. ---migraines Is the patient pregnant or possibly pregnant? (Ask all females between the ages of 8612-55) ---No Is this a behavioral health or substance abuse call? ---No Guidelines Guideline Title Affirmed Question Affirmed Notes Blister - Foot and Hand [1] Cause unknown AND [2] new blisters are developing Final Disposition User See Physician within 24 Hours Atkins, RN, Jacquilin Comments Patient states she already has an appointment Thursday morning Referrals REFERRED TO PCP OFFICE Disagree/Comply: Comply

## 2016-10-18 NOTE — Telephone Encounter (Signed)
Pt called back returning your call. Thank you!  Call pt @ [phone]

## 2016-10-18 NOTE — Telephone Encounter (Signed)
Appointment scheduled 10/19/2016

## 2016-10-18 NOTE — Telephone Encounter (Signed)
Left message for patient to return call to office. 

## 2016-10-19 ENCOUNTER — Ambulatory Visit (INDEPENDENT_AMBULATORY_CARE_PROVIDER_SITE_OTHER): Payer: 59 | Admitting: Family

## 2016-10-19 ENCOUNTER — Encounter: Payer: Self-pay | Admitting: Family

## 2016-10-19 VITALS — BP 98/62 | HR 102 | Temp 98.0°F | Ht 67.0 in | Wt 168.6 lb

## 2016-10-19 DIAGNOSIS — B084 Enteroviral vesicular stomatitis with exanthem: Secondary | ICD-10-CM | POA: Diagnosis not present

## 2016-10-19 MED ORDER — LIDOCAINE VISCOUS 2 % MT SOLN: 15.0000 mL | OROMUCOSAL | 0 refills | Status: DC | PRN

## 2016-10-19 NOTE — Progress Notes (Signed)
Pre visit review using our clinic review tool, if applicable. No additional management support is needed unless otherwise documented below in the visit note. 

## 2016-10-19 NOTE — Progress Notes (Signed)
Subjective:    Patient ID: Lori BernardKelli Darrough, female    DOB: October 22, 1991, 25 y.o.   MRN: [id number]  CC: Lori Kennedy is a 25 y.o. female who presents today for an acute visit.    HPI: CC: painful blisters on hands, foot,and throat 3 days later, improving. One year old nephew who has HFM who lives with her. Endorses sore throat, low grade fever tmax 99.5, sinus pressure ( resolved), chills ( resolved). No cough, wheezing. Taking throat  Lozenges with some relief.      HISTORY:  Past Medical History:  Diagnosis Date  . Allergy   . Chicken pox   . Depression   . GERD (gastroesophageal reflux disease)   . Migraine    Past Surgical History:  Procedure Laterality Date  . PATELLA RELEASE AND MANIPULATION Right 2007  . TONSILLECTOMY  1996   Family History  Problem Relation Age of Onset  . Depression Mother   . Cancer Maternal Grandfather   . Early death Paternal Grandfather     Allergies: Maxalt [rizatriptan] and Pistachio nut (diagnostic) Current Outpatient Prescriptions on File Prior to Visit  Medication Sig Dispense Refill  . b complex vitamins tablet Take 1 tablet by mouth daily.    . cetirizine (ZYRTEC) 10 MG tablet Take 10 mg by mouth daily.    . Diclofenac Sodium (VOLTAREN PO) Take by mouth.    Marland Kitchen. FLUoxetine (PROZAC) 20 MG capsule TAKE ONE CAPSULE BY MOUTH EVERY DAY 90 capsule 1  . fluticasone (FLONASE) 50 MCG/ACT nasal spray Place 1 spray into both nostrils daily.    . Magnesium Oxide 250 MG TABS Take 1 tablet by mouth daily.    . Probiotic Product (PROBIOTIC ACIDOPHILUS BIOBEADS) CAPS Take 1 capsule by mouth daily.    . propranolol (INDERAL) 10 MG tablet Take 0.5 tablets (5 mg total) by mouth 3 (three) times daily. As needed for palpitations 30 tablet 3  . traZODone (DESYREL) 50 MG tablet Take 0.5-1 tablets (25-50 mg total) by mouth at bedtime as needed for sleep. 30 tablet 3   No current facility-administered medications on file prior to visit.     Social History    Substance Use Topics  . Smoking status: Never Smoker  . Smokeless tobacco: Never Used  . Alcohol use 0.0 oz/week     Comment: occasionally    Review of Systems  Constitutional: Negative for chills and fever.  HENT: Positive for sore throat and trouble swallowing. Negative for congestion, sinus pain, sinus pressure and voice change.   Respiratory: Negative for cough, shortness of breath and wheezing.   Cardiovascular: Negative for chest pain and palpitations.  Gastrointestinal: Negative for nausea and vomiting.  Musculoskeletal: Negative for myalgias.  Skin: Positive for rash.      Objective:    There were no vitals taken for this visit.   Physical Exam  Constitutional: She appears well-developed and well-nourished.  HENT:  Head: Normocephalic and atraumatic.  Right Ear: Hearing, tympanic membrane, external ear and ear canal normal. No drainage, swelling or tenderness. No foreign bodies. Tympanic membrane is not erythematous and not bulging. No middle ear effusion. No decreased hearing is noted.  Left Ear: Hearing, tympanic membrane, external ear and ear canal normal. No drainage, swelling or tenderness. No foreign bodies. Tympanic membrane is not erythematous and not bulging.  No middle ear effusion. No decreased hearing is noted.  Nose: Nose normal. No rhinorrhea. Right sinus exhibits no maxillary sinus tenderness and no frontal sinus tenderness. Left sinus exhibits  no maxillary sinus tenderness and no frontal sinus tenderness.  Mouth/Throat: Uvula is midline, oropharynx is clear and moist and mucous membranes are normal. Oral lesions present. Uvula swelling present. No oropharyngeal exudate, posterior oropharyngeal edema, posterior oropharyngeal erythema or tonsillar abscesses.    2 discrete vesicular lesions posterior oropharynx.  Eyes: Conjunctivae are normal.  Cardiovascular: Regular rhythm, normal heart sounds and normal pulses.   Pulmonary/Chest: Effort normal and breath  sounds normal. She has no wheezes. She has no rhonchi. She has no rales.  Lymphadenopathy:       Head (right side): No submental, no submandibular, no tonsillar, no preauricular, no posterior auricular and no occipital adenopathy present.       Head (left side): No submental, no submandibular, no tonsillar, no preauricular, no posterior auricular and no occipital adenopathy present.    She has no cervical adenopathy.  Neurological: She is alert.  Skin: Skin is warm and dry. Rash noted. Rash is vesicular.  Healing discrete, clear filled vesicular lesions ventral aspect of hands and soles of feet.  Psychiatric: She has a normal mood and affect. Her speech is normal and behavior is normal. Thought content normal.  Vitals reviewed.      Assessment & Plan:   1. Hand, foot and mouth disease Symptoms consistent with HFM, likely from  Coxsackievirus A16 and enterovirus A71. Will treat symptomatically. Work note given.    - lidocaine (XYLOCAINE) 2 % solution; Use as directed 15 mLs in the mouth or throat every 3 (three) hours as needed for mouth pain (gargle; may spit or swallow).  Dispense: 100 mL; Refill: 0    I am having Ms. Fallen maintain her b complex vitamins, Diclofenac Sodium (VOLTAREN PO), traZODone, FLUoxetine, PROBIOTIC ACIDOPHILUS BIOBEADS, fluticasone, Magnesium Oxide, cetirizine, and propranolol.   No orders of the defined types were placed in this encounter.   Return precautions given.   Risks, benefits, and alternatives of the medications and treatment plan prescribed today were discussed, and patient expressed understanding.   Education regarding symptom management and diagnosis given to patient on AVS.  Continue to follow with TULLO, Mar DaringERESA L, MD for routine health maintenance.   Lori Kennedy and I agreed with plan.   Rennie PlowmanMargaret Arnett, FNP

## 2016-10-19 NOTE — Telephone Encounter (Signed)
Patient saw Arnett NP this mornng.

## 2016-10-19 NOTE — Patient Instructions (Signed)
Suspect viral hand, foot, mouth- should get better in time - 7 to 10 days.  Take ibuprofen for pain.   If there is no improvement in your symptoms, or if there is any worsening of symptoms, or if you have any additional concerns, please return for re-evaluation; or, if we are closed, consider going to the Emergency Room for evaluation if symptoms urgent.

## 2016-12-04 ENCOUNTER — Encounter: Payer: Self-pay | Admitting: Internal Medicine

## 2016-12-06 ENCOUNTER — Telehealth: Payer: Self-pay | Admitting: *Deleted

## 2016-12-06 NOTE — Telephone Encounter (Signed)
Pt sent the mychart message below, she has requested a update  Contact [phone]  Hey Dr. 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 With that said I have moved down to the [place] area and Im going to school at [employer]. 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 The other night I had an anxiety attack at work. There was alot going on and my mangers kept saying that I put orders in the computer wrong and I just lost it. I occationaly have episodes where I constantly worry about something that is so small and insignificant. I was wondering if there was any kind of medicine or recommend anything that I can take to reduce these episode and prevent the attacks from happening again? Thanks.  Donna BernardKelli Mongiello

## 2016-12-07 NOTE — Telephone Encounter (Addendum)
I have set the Pharmacy, for the Prozac 40 mg.

## 2016-12-08 ENCOUNTER — Other Ambulatory Visit: Payer: Self-pay | Admitting: Internal Medicine

## 2016-12-08 MED ORDER — FLUOXETINE HCL 40 MG PO CAPS: 40.0000 mg | ORAL_CAPSULE | Freq: Every day | ORAL | 0 refills | Status: DC

## 2016-12-08 MED ORDER — BUSPIRONE HCL 7.5 MG PO TABS: 7.5000 mg | ORAL_TABLET | Freq: Three times a day (TID) | ORAL | 2 refills | Status: DC

## 2017-02-21 ENCOUNTER — Encounter: Payer: Self-pay | Admitting: Internal Medicine

## 2017-03-13 ENCOUNTER — Other Ambulatory Visit: Payer: Self-pay | Admitting: Internal Medicine

## 2017-04-02 ENCOUNTER — Encounter: Payer: Self-pay | Admitting: *Deleted

## 2017-04-11 ENCOUNTER — Ambulatory Visit: Payer: Self-pay | Admitting: Internal Medicine

## 2017-04-29 DIAGNOSIS — B009 Herpesviral infection, unspecified: Secondary | ICD-10-CM

## 2017-04-29 HISTORY — DX: Herpesviral infection, unspecified: B00.9

## 2017-07-30 ENCOUNTER — Ambulatory Visit (INDEPENDENT_AMBULATORY_CARE_PROVIDER_SITE_OTHER): Payer: BLUE CROSS/BLUE SHIELD | Admitting: Obstetrics and Gynecology

## 2017-07-30 ENCOUNTER — Encounter: Payer: Self-pay | Admitting: Obstetrics and Gynecology

## 2017-07-30 VITALS — BP 108/66 | HR 125 | Ht 67.25 in | Wt 162.0 lb

## 2017-07-30 DIAGNOSIS — Z30017 Encounter for initial prescription of implantable subdermal contraceptive: Secondary | ICD-10-CM

## 2017-07-30 DIAGNOSIS — Z30432 Encounter for removal of intrauterine contraceptive device: Secondary | ICD-10-CM | POA: Diagnosis not present

## 2017-07-30 NOTE — Progress Notes (Signed)
   Chief Complaint  Patient presents with  . Contraception    IUD removal     History of Present Illness:  Lori Kennedy is a 26 y.o. that had a Mirena IUD placed approximately 2 years ago. Since that time, she has done well until bleeding started a wk ago with cramping. Pt is concerned because she had been amenorrheic prior to this bleeding episode. Pt is interested in nexplanon. Pt's partner can feel strings and it's painful for him.  Pt is past due for annual.   BP 108/66   Pulse (!) 125   Ht 5' 7.25" (1.708 m)   Wt 162 lb (73.5 kg)   BMI 25.18 kg/m   Pelvic exam:  Two IUD strings present seen coming from the cervical os. EGBUS, vaginal vault and cervix: within normal limits  IUD Removal Strings of IUD identified and grasped.  IUD removed without problem with ring forceps.  Pt tolerated this well.  IUD noted to be intact.  Assessment:  Encounter for IUD removal  Encounter for initial prescription of implantable subdermal contraceptive - BC options discussed. Pt wants nexplanon. Aware of BTB sx. Handout given. RTO for annual/insertion. Condoms in meantime.     Plan: IUD removed and plan for contraception is condoms, nexplanon. She was amenable to this plan.  Alicia B. Copland, PA-C 07/30/2017 5:12 PM

## 2017-09-19 ENCOUNTER — Ambulatory Visit (INDEPENDENT_AMBULATORY_CARE_PROVIDER_SITE_OTHER): Payer: BLUE CROSS/BLUE SHIELD | Admitting: Maternal Newborn

## 2017-09-19 ENCOUNTER — Encounter: Payer: Self-pay | Admitting: Maternal Newborn

## 2017-09-19 VITALS — BP 120/60 | HR 90 | Ht 67.25 in | Wt 166.0 lb

## 2017-09-19 DIAGNOSIS — Z01419 Encounter for gynecological examination (general) (routine) without abnormal findings: Secondary | ICD-10-CM | POA: Diagnosis not present

## 2017-09-19 DIAGNOSIS — Z124 Encounter for screening for malignant neoplasm of cervix: Secondary | ICD-10-CM

## 2017-09-19 NOTE — Progress Notes (Signed)
Gynecology Annual Exam  PCP: Sherlene Shamsullo, Teresa L, MD  Chief Complaint:  Chief Complaint  Patient presents with  . Gynecologic Exam    History of Present Illness: Patient is a 26 y.o. G0P0000 presents for annual exam. The patient has no complaints today.   LMP: Patient's last menstrual period was 08/24/2017. First cycle since Mirena was removed on 07/30/2017. Light flow. Duration of flow: 2 days Heavy Menses: no Clots: no Intermenstrual Bleeding: no Postcoital Bleeding: no Dysmenorrhea: no  The patient is sexually active. She currently uses none for contraception. She denies dyspareunia.  The patient does perform self breast exams.  There is no notable family history of breast or ovarian cancer in her family.  The patient wears seatbelts: yes.   The patient has regular exercise: yes.    The patient denies current symptoms of depression.    Review of Systems  Constitutional: Negative for chills, fever and weight loss.  HENT: Negative for congestion, ear pain and sore throat.   Eyes: Negative for blurred vision, double vision and redness.  Respiratory: Negative for cough, shortness of breath and wheezing.   Cardiovascular: Negative for chest pain and palpitations.  Gastrointestinal: Negative for abdominal pain, diarrhea, heartburn and nausea.  Genitourinary: Negative for dysuria, frequency and urgency.  Musculoskeletal: Negative.   Skin: Negative.   Neurological: Negative.   Endo/Heme/Allergies: Negative.   Psychiatric/Behavioral: Negative for depression. The patient is not nervous/anxious.   All other systems reviewed and are negative.   Past Medical History:  Past Medical History:  Diagnosis Date  . Allergy   . Chicken pox   . Depression   . GERD (gastroesophageal reflux disease)   . Herpes 04/2017  . History of Papanicolaou smear of cervix 2013; 06/10/15   NEG; NEG - CT/GC/TR NEG  . Migraine     Past Surgical History:  Past Surgical History:  Procedure  Laterality Date  . PATELLA RELEASE AND MANIPULATION Right 2007  . TONSILLECTOMY  1996  . WISDOM TOOTH EXTRACTION  2012    Gynecologic History:  Patient's last menstrual period was 08/24/2017. Contraception: none Last Pap: 06/10/2015. Results were: NIL   Obstetric History: G0P0000  Family History:  Family History  Problem Relation Age of Onset  . Depression Mother   . Endometriosis Mother 30       BSO  . Fibromyalgia Mother   . Neurologic Disorder Father   . Cancer Maternal Grandmother 70       kidney  . Early death Paternal Grandfather     Social History:  Social History   Socioeconomic History  . Marital status: Divorced    Spouse name: Not on file  . Number of children: 0  . Years of education: 7412  . Highest education level: Not on file  Social Needs  . Financial resource strain: Not on file  . Food insecurity - worry: Not on file  . Food insecurity - inability: Not on file  . Transportation needs - medical: Not on file  . Transportation needs - non-medical: Not on file  Occupational History  . Occupation: FLIGHT ATTENDANT  Tobacco Use  . Smoking status: Current Every Day Smoker    Types: Cigars  . Smokeless tobacco: Never Used  Substance and Sexual Activity  . Alcohol use: Yes    Alcohol/week: 0.0 oz    Comment: occasionally  . Drug use: No  . Sexual activity: Yes    Birth control/protection: None  Other Topics Concern  . Not on file  Social History Narrative  . Not on file    Allergies:  Allergies  Allergen Reactions  . Maxalt [Rizatriptan] Swelling  . Effexor [Venlafaxine]     THROAT CLOSING  . Pistachio Nut (Diagnostic)     Medications: Prior to Admission medications   Medication Sig Start Date End Date Taking? Authorizing Provider  cetirizine (ZYRTEC) 10 MG tablet Take 10 mg by mouth daily.   Yes [provider]  fluticasone (FLONASE) 50 MCG/ACT nasal spray Place 1 spray into both nostrils daily.   Yes [provider]    Probiotic Product (PROBIOTIC ACIDOPHILUS BIOBEADS) CAPS Take 1 capsule by mouth daily.   Yes [provider]  traZODone (DESYREL) 50 MG tablet Take 0.5-1 tablets (25-50 mg total) by mouth at bedtime as needed for sleep. 08/11/16   Sherlene Shamsullo, Teresa L, MD  valACYclovir (VALTREX) 500 MG tablet Take 1 tablet by mouth daily. 09/14/17   [provider]    Physical Exam Vitals: Blood pressure 120/60, pulse 90, height 5' 7.25" (1.708 m), weight 166 lb (75.3 kg), last menstrual period 08/24/2017.  General: NAD HEENT: normocephalic, anicteric Thyroid: no enlargement, no palpable nodules Pulmonary: No increased work of breathing, CTAB Cardiovascular: RRR, distal pulses 2+ Breasts: Breasts symmetrical, no tenderness, no palpable nodules or masses, no skin or nipple retraction present, no nipple discharge.  No axillary or supraclavicular lymphadenopathy. Abdomen: NABS, soft, non-tender, non-distended.  Umbilicus without lesions.  No hepatomegaly, splenomegaly or masses palpable. No evidence of hernia  Genitourinary:  External: Normal external female genitalia.  Normal urethral  meatus, normal Bartholin's and Skene's glands.    Vagina: Normal vaginal mucosa, no evidence of prolapse.    Cervix: Grossly normal in appearance, no bleeding  Uterus: Non-enlarged, mobile, normal contour.  No CMT  Adnexa: ovaries non-enlarged, no adnexal masses  Rectal: deferred  Lymphatic: no evidence of inguinal lymphadenopathy Extremities: no edema, erythema, or tenderness Neurologic: Grossly intact Psychiatric: mood appropriate, affect full  Assessment: 26 y.o. G0P0000 presents for routine annual exam.  Plan: Problem List Items Addressed This Visit    None    Visit Diagnoses    Encounter for annual routine gynecological examination    -  Primary   Pap smear for cervical cancer screening       Relevant Orders   Pap IG w/ reflex to HPV when ASC-U      1) STI screening was offered and  declined.  2) ASCCP guidelines and rationale discussed.  Patient opts for every 3 year screening interval.  3) Contraception - Patient is trying to conceive. Advised prenatal vitamins with folate/iron and avoiding teratogens.  4) Routine healthcare maintenance including cholesterol, diabetes screening discussed; managed by PCP.  5) Follow up 1 year for routine annual exam.  Marcelyn BruinsJacelyn Shyheim Tanney, CNM 09/19/2017  2:24 PM

## 2017-09-25 LAB — PAP IG W/ RFLX HPV ASCU: PAP Smear Comment: 0

## 2017-09-25 LAB — HPV DNA PROBE HIGH RISK, AMPLIFIED: HPV, high-risk: POSITIVE — AB

## 2017-10-16 ENCOUNTER — Telehealth: Payer: Self-pay | Admitting: Maternal Newborn

## 2017-10-16 NOTE — Telephone Encounter (Signed)
Patient following up with provider in [place] for a colposcopy for her recent ASCUS/HPV+ result.  Marcelyn BruinsJacelyn Schmid, CNM 10/16/2017  5:15 PM
